# Patient Record
Sex: Male | Born: 2000 | Hispanic: No | Marital: Single | Smoking: Never smoker
Health system: Southern US, Community
[De-identification: ages and names within clinical notes are randomized; demographics above are authoritative.]

## PROBLEM LIST (undated history)

## (undated) ENCOUNTER — Emergency Department (HOSPITAL_COMMUNITY): Discharge status: Absent without leave | Payer: Medicaid Other

---
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
## 2020-01-18 DIAGNOSIS — Z20822 Contact with and (suspected) exposure to covid-19: Secondary | ICD-10-CM | POA: Diagnosis not present

## 2021-03-17 DIAGNOSIS — R300 Dysuria: Secondary | ICD-10-CM | POA: Diagnosis not present

## 2021-10-12 ENCOUNTER — Encounter: Payer: Self-pay | Admitting: Nurse Practitioner

## 2021-10-12 ENCOUNTER — Ambulatory Visit: Payer: Medicaid Other | Admitting: Nurse Practitioner

## 2021-10-12 VITALS — BP 123/72 | HR 68 | Temp 98.6°F | Ht 67.0 in | Wt 158.2 lb

## 2021-10-12 DIAGNOSIS — R21 Rash and other nonspecific skin eruption: Secondary | ICD-10-CM

## 2021-10-12 MED ORDER — HYDROCORTISONE 1 % EX LOTN: 1.0000 | TOPICAL_LOTION | Freq: Two times a day (BID) | CUTANEOUS | 0 refills | Status: DC

## 2021-10-12 NOTE — Progress Notes (Signed)
Acute Office Visit  Subjective:     Patient ID: Luke Hughes, male    DOB: [birth year], 21 y.o.   MRN: [id number]  Chief Complaint  Patient presents with   Rash    Rash under nose for about 6 months     Rash This is a recurrent problem. The current episode started yesterday. The problem has been gradually improving since onset. The affected locations include the face. The rash is characterized by dryness, itchiness and peeling. He was exposed to nothing. Pertinent negatives include no congestion, cough, facial edema, fatigue or fever. Past treatments include topical steroids. The treatment provided mild relief.     Review of Systems  Constitutional:  Negative for fatigue and fever.  HENT:  Negative for congestion.   Eyes: Negative.   Respiratory:  Negative for cough.   Cardiovascular: Negative.   Skin:  Positive for rash.  All other systems reviewed and are negative.       Objective:    BP 123/72   Pulse 68   Temp 98.6 F (37 C)   Ht 5\' 7"  (1.702 m)   Wt 158 lb 3.2 oz (71.8 kg)   SpO2 97%   BMI 24.78 kg/m  BP Readings from Last 3 Encounters:  10/12/21 123/72  03/08/18 122/72 (69 %, Z = 0.50 /  68 %, Z = 0.47)*  09/01/17 122/80 (72 %, Z = 0.58 /  91 %, Z = 1.34)*   *BP percentiles are based on the 2017 AAP Clinical Practice Guideline for boys   Wt Readings from Last 3 Encounters:  10/12/21 158 lb 3.2 oz (71.8 kg)  03/08/18 142 lb 9.6 oz (64.7 kg) (42 %, Z= -0.21)*  09/01/17 140 lb 12.8 oz (63.9 kg) (43 %, Z= -0.16)*   * Growth percentiles are based on CDC (Boys, 2-20 Years) data.      Physical Exam Vitals and nursing note reviewed.  Constitutional:      Appearance: Normal appearance.  HENT:     Head: Normocephalic.     Right Ear: External ear normal.     Left Ear: External ear normal.     Nose: Nose normal.  Cardiovascular:     Rate and Rhythm: Normal rate and regular rhythm.     Pulses: Normal pulses.     Heart sounds: Normal heart sounds.   Pulmonary:     Effort: Pulmonary effort is normal.     Breath sounds: Normal breath sounds.  Skin:    Findings: Rash present.  Neurological:     Mental Status: He is alert and oriented to person, place, and time.     No results found for any visits on 10/12/21.      Assessment & Plan:  Rash assessed, I  provided sample of CeraV to help with future skin break down and rash. B12 completed to rule out b12 deficiency dermatitis.  Patient knows to follow up with worsening unresolved symptoms.   Problem List Items Addressed This Visit   None Visit Diagnoses     Rash    -  Primary   Relevant Medications   hydrocortisone 1 % lotion   Other Relevant Orders   Vitamin B12       Meds ordered this encounter  Medications   hydrocortisone 1 % lotion    Sig: Apply 1 Application topically 2 (two) times daily.    Dispense:  118 mL    Refill:  0    Order Specific Question:   Supervising  Provider    Answer:   Mechele Claude [[id number]]    Return if symptoms worsen or fail to improve.  Daryll Drown, NP

## 2021-10-12 NOTE — Patient Instructions (Signed)
Rash, Adult  A rash is a change in the color of your skin. A rash can also change the way your skin feels. There are many different conditions and factors that can cause a rash. Follow these instructions at home: The goal of treatment is to stop the itching and keep the rash from spreading. Watch for any changes in your symptoms. Let your doctor know about them. Follow these instructions to help with your condition: Medicine Take or apply over-the-counter and prescription medicines only as told by your doctor. These may include medicines: To treat red or swollen skin (corticosteroid creams). To treat itching. To treat an allergy (oral antihistamines). To treat very bad symptoms (oral corticosteroids).  Skin care Put cool cloths (compresses) on the affected areas. Do not scratch or rub your skin. Avoid covering the rash. Make sure that the rash is exposed to air as much as possible. Managing itching and discomfort Avoid hot showers or baths. These can make itching worse. A cold shower may help. Try taking a bath with: Epsom salts. You can get these at your local pharmacy or grocery store. Follow the instructions on the package. Baking soda. Pour a small amount into the bath as told by your doctor. Colloidal oatmeal. You can get this at your local pharmacy or grocery store. Follow the instructions on the package. Try putting baking soda paste onto your skin. Stir water into baking soda until it gets like a paste. Try putting on a lotion that relieves itchiness (calamine lotion). Keep cool and out of the sun. Sweating and being hot can make itching worse. General instructions  Rest as needed. Drink enough fluid to keep your pee (urine) pale yellow. Wear loose-fitting clothing. Avoid scented soaps, detergents, and perfumes. Use gentle soaps, detergents, perfumes, and other cosmetic products. Avoid anything that causes your rash. Keep a journal to help track what causes your rash. Write  down: What you eat. What cosmetic products you use. What you drink. What you wear. This includes jewelry. Keep all follow-up visits as told by your doctor. This is important. Contact a doctor if: You sweat at night. You lose weight. You pee (urinate) more than normal. You pee less than normal, or you notice that your pee is a darker color than normal. You feel weak. You throw up (vomit). Your skin or the whites of your eyes look yellow (jaundice). Your skin: Tingles. Is numb. Your rash: Does not go away after a few days. Gets worse. You are: More thirsty than normal. More tired than normal. You have: New symptoms. Pain in your belly (abdomen). A fever. Watery poop (diarrhea). Get help right away if: You have a fever and your symptoms suddenly get worse. You start to feel mixed up (confused). You have a very bad headache or a stiff neck. You have very bad joint pains or stiffness. You have jerky movements that you cannot control (seizure). Your rash covers all or most of your body. The rash may or may not be painful. You have blisters that: Are on top of the rash. Grow larger. Grow together. Are painful. Are inside your nose or mouth. You have a rash that: Looks like purple pinprick-sized spots all over your body. Has a "bull's eye" or looks like a target. Is red and painful, causes your skin to peel, and is not from being in the sun too long. Summary A rash is a change in the color of your skin. A rash can also change the way your skin   feels. The goal of treatment is to stop the itching and keep the rash from spreading. Take or apply over-the-counter and prescription medicines only as told by your doctor. Contact a doctor if you have new symptoms or symptoms that get worse. Keep all follow-up visits as told by your doctor. This is important. This information is not intended to replace advice given to you by your health care provider. Make sure you discuss any  questions you have with your health care provider. Document Revised: 12/03/2020 Document Reviewed: 12/03/2020 Elsevier Patient Education  2023 [employer]  

## 2021-10-13 LAB — VITAMIN B12: Vitamin B-12: 901 pg/mL (ref 232–1245)

## 2021-11-11 ENCOUNTER — Telehealth: Payer: Self-pay | Admitting: Nurse Practitioner

## 2021-11-23 ENCOUNTER — Ambulatory Visit (INDEPENDENT_AMBULATORY_CARE_PROVIDER_SITE_OTHER): Payer: Medicaid Other

## 2021-11-23 ENCOUNTER — Encounter: Payer: Self-pay | Admitting: Nurse Practitioner

## 2021-11-23 ENCOUNTER — Ambulatory Visit: Payer: Medicaid Other | Admitting: Nurse Practitioner

## 2021-11-23 VITALS — BP 115/64 | HR 88 | Temp 98.8°F | Ht 67.0 in | Wt 163.0 lb

## 2021-11-23 DIAGNOSIS — R6882 Decreased libido: Secondary | ICD-10-CM | POA: Diagnosis not present

## 2021-11-23 DIAGNOSIS — M5410 Radiculopathy, site unspecified: Secondary | ICD-10-CM

## 2021-11-23 DIAGNOSIS — M545 Low back pain, unspecified: Secondary | ICD-10-CM | POA: Diagnosis not present

## 2021-11-23 MED ORDER — METHOCARBAMOL 500 MG PO TABS: 500.0000 mg | ORAL_TABLET | Freq: Four times a day (QID) | ORAL | 1 refills | Status: AC

## 2021-11-23 MED ORDER — IBUPROFEN 600 MG PO TABS: 600.0000 mg | ORAL_TABLET | Freq: Three times a day (TID) | ORAL | 0 refills | Status: AC | PRN

## 2021-11-23 NOTE — Patient Instructions (Signed)
Acute Back Pain, Adult Acute back pain is sudden and usually short-lived. It is often caused by an injury to the muscles and tissues in the back. The injury may result from: A muscle, tendon, or ligament getting overstretched or torn. Ligaments are tissues that connect bones to each other. Lifting something improperly can cause a back strain. Wear and tear (degeneration) of the spinal disks. Spinal disks are circular tissue that provide cushioning between the bones of the spine (vertebrae). Twisting motions, such as while playing sports or doing yard work. A hit to the back. Arthritis. You may have a physical exam, lab tests, and imaging tests to find the cause of your pain. Acute back pain usually goes away with rest and home care. Follow these instructions at home: Managing pain, stiffness, and swelling Take over-the-counter and prescription medicines only as told by your health care provider. Treatment may include medicines for pain and inflammation that are taken by mouth or applied to the skin, or muscle relaxants. Your health care provider may recommend applying ice during the first 24-48 hours after your pain starts. To do this: Put ice in a plastic bag. Place a towel between your skin and the bag. Leave the ice on for 20 minutes, 2-3 times a day. Remove the ice if your skin turns bright red. This is very important. If you cannot feel pain, heat, or cold, you have a greater risk of damage to the area. If directed, apply heat to the affected area as often as told by your health care provider. Use the heat source that your health care provider recommends, such as a moist heat pack or a heating pad. Place a towel between your skin and the heat source. Leave the heat on for 20-30 minutes. Remove the heat if your skin turns bright red. This is especially important if you are unable to feel pain, heat, or cold. You have a greater risk of getting burned. Activity  Do not stay in bed. Staying in  bed for more than 1-2 days can delay your recovery. Sit up and stand up straight. Avoid leaning forward when you sit or hunching over when you stand. If you work at a desk, sit close to it so you do not need to lean over. Keep your chin tucked in. Keep your neck drawn back, and keep your elbows bent at a 90-degree angle (right angle). Sit high and close to the steering wheel when you drive. Add lower back (lumbar) support to your car seat, if needed. Take short walks on even surfaces as soon as you are able. Try to increase the length of time you walk each day. Do not sit, drive, or stand in one place for more than 30 minutes at a time. Sitting or standing for long periods of time can put stress on your back. Do not drive or use heavy machinery while taking prescription pain medicine. Use proper lifting techniques. When you bend and lift, use positions that put less stress on your back: Bend your knees. Keep the load close to your body. Avoid twisting. Exercise regularly as told by your health care provider. Exercising helps your back heal faster and helps prevent back injuries by keeping muscles strong and flexible. Work with a physical therapist to make a safe exercise program, as recommended by your health care provider. Do any exercises as told by your physical therapist. Lifestyle Maintain a healthy weight. Extra weight puts stress on your back and makes it difficult to have good   posture. Avoid activities or situations that make you feel anxious or stressed. Stress and anxiety increase muscle tension and can make back pain worse. Learn ways to manage anxiety and stress, such as through exercise. General instructions Sleep on a firm mattress in a comfortable position. Try lying on your side with your knees slightly bent. If you lie on your back, put a pillow under your knees. Keep your head and neck in a straight line with your spine (neutral position) when using electronic equipment like  smartphones or pads. To do this: Raise your smartphone or pad to look at it instead of bending your head or neck to look down. Put the smartphone or pad at the level of your face while looking at the screen. Follow your treatment plan as told by your health care provider. This may include: Cognitive or behavioral therapy. Acupuncture or massage therapy. Meditation or yoga. Contact a health care provider if: You have pain that is not relieved with rest or medicine. You have increasing pain going down into your legs or buttocks. Your pain does not improve after 2 weeks. You have pain at night. You lose weight without trying. You have a fever or chills. You develop nausea or vomiting. You develop abdominal pain. Get help right away if: You develop new bowel or bladder control problems. You have unusual weakness or numbness in your arms or legs. You feel faint. These symptoms may represent a serious problem that is an emergency. Do not wait to see if the symptoms will go away. Get medical help right away. Call your local emergency services (911 in the [place] Do not drive yourself to the hospital. Summary Acute back pain is sudden and usually short-lived. Use proper lifting techniques. When you bend and lift, use positions that put less stress on your back. Take over-the-counter and prescription medicines only as told by your health care provider, and apply heat or ice as told. This information is not intended to replace advice given to you by your health care provider. Make sure you discuss any questions you have with your health care provider. Document Revised: 05/15/2020 Document Reviewed: 05/15/2020 Elsevier Patient Education  2023 Elsevier Inc.  

## 2021-11-23 NOTE — Progress Notes (Signed)
Acute Office Visit  Subjective:     Patient ID: Luke Hughes, male    DOB: 09/20/2000, 21 y.o.   MRN: [id number]  Chief Complaint  Patient presents with   Back Pain    Back Pain This is a new problem. The current episode started in the past 7 days. The problem occurs constantly. The problem is unchanged. The pain is present in the gluteal. The quality of the pain is described as aching. The pain is at a severity of 4/10. The pain is moderate. The pain is The same all the time. The symptoms are aggravated by bending and position. Stiffness is present In the morning. Pertinent negatives include no bowel incontinence, dysuria, headaches, numbness, paresis or tingling. He has tried nothing for the symptoms.     Review of Systems  Constitutional: Negative.   HENT: Negative.    Cardiovascular: Negative.   Gastrointestinal:  Negative for bowel incontinence.  Genitourinary:  Negative for dysuria.  Musculoskeletal:  Positive for back pain.  Skin: Negative.   Neurological:  Negative for tingling, numbness and headaches.  All other systems reviewed and are negative.       Objective:    BP 115/64   Pulse 88   Temp 98.8 F (37.1 C)   Ht 5\' 7"  (1.702 m)   Wt 163 lb (73.9 kg)   SpO2 96%   BMI 25.53 kg/m  BP Readings from Last 3 Encounters:  11/23/21 115/64  10/12/21 123/72  03/08/18 122/72 (69 %, Z = 0.50 /  68 %, Z = 0.47)*   *BP percentiles are based on the 2017 AAP Clinical Practice Guideline for boys   Wt Readings from Last 3 Encounters:  11/23/21 163 lb (73.9 kg)  10/12/21 158 lb 3.2 oz (71.8 kg)  03/08/18 142 lb 9.6 oz (64.7 kg) (42 %, Z= -0.21)*   * Growth percentiles are based on CDC (Boys, 2-20 Years) data.      Physical Exam Vitals and nursing note reviewed.  Constitutional:      Appearance: Normal appearance.  HENT:     Right Ear: External ear normal.     Left Ear: External ear normal.     Nose: Nose normal.  Eyes:     Conjunctiva/sclera: Conjunctivae  normal.  Cardiovascular:     Rate and Rhythm: Normal rate and regular rhythm.     Pulses: Normal pulses.     Heart sounds: Normal heart sounds.  Pulmonary:     Effort: Pulmonary effort is normal.     Breath sounds: Normal breath sounds.  Abdominal:     General: Bowel sounds are normal.  Musculoskeletal:     Thoracic back: Tenderness present. Decreased range of motion.       Back:     Comments: Right side thoracic back pain.  Skin:    General: Skin is warm.     Findings: No erythema or rash.  Neurological:     General: No focal deficit present.     Mental Status: He is alert and oriented to person, place, and time.     No results found for any visits on 11/23/21.      Assessment & Plan:  Back pain symptoms not well controlled over the past 7 days.  Patient reports lifting boxes at work and probably twisted wrong.  Completed back x-ray will follow-up with MRI if negative to rule out pinched nerve.  Pain is radiating to bilateral arms.  Ibuprofen 600 mg tablet as needed for pain,  Robaxin for musculoskeletal pain as needed.  Follow-up with unresolved symptoms.   Patient is reporting low libido in the past few weeks.  Completed testosterone labs results pending. Problem List Items Addressed This Visit   None Visit Diagnoses     Low libido    -  Primary   Relevant Orders   Testosterone,Free and Total   Back pain with radiculopathy       Relevant Medications   methocarbamol (ROBAXIN) 500 MG tablet   ibuprofen (ADVIL) 600 MG tablet   Other Relevant Orders   DG Lumbar Spine 2-3 Views       Meds ordered this encounter  Medications   methocarbamol (ROBAXIN) 500 MG tablet    Sig: Take 1 tablet (500 mg total) by mouth 4 (four) times daily.    Dispense:  30 tablet    Refill:  1    Order Specific Question:   Supervising Provider    Answer:   Standley Brooking   ibuprofen (ADVIL) 600 MG tablet    Sig: Take 1 tablet (600 mg total) by mouth every 8 (eight) hours as  needed.    Dispense:  30 tablet    Refill:  0    Order Specific Question:   Supervising Provider    Answer:   Mechele Claude [phone]    Return if symptoms worsen or fail to improve.  Daryll Drown, NP

## 2021-11-26 NOTE — Progress Notes (Signed)
Patient returning call. Please call back. Unable to see in [place]

## 2021-11-29 LAB — TESTOSTERONE,FREE AND TOTAL
Testosterone, Free: 13.3 pg/mL (ref 9.3–26.5)
Testosterone: 597 ng/dL (ref 264–916)

## 2022-01-03 DIAGNOSIS — J029 Acute pharyngitis, unspecified: Secondary | ICD-10-CM | POA: Diagnosis not present

## 2022-02-22 DIAGNOSIS — B002 Herpesviral gingivostomatitis and pharyngotonsillitis: Secondary | ICD-10-CM | POA: Diagnosis not present

## 2022-02-22 DIAGNOSIS — M25532 Pain in left wrist: Secondary | ICD-10-CM | POA: Diagnosis not present

## 2022-02-22 DIAGNOSIS — X500XXA Overexertion from strenuous movement or load, initial encounter: Secondary | ICD-10-CM | POA: Diagnosis not present

## 2024-03-10 NOTE — Progress Notes (Signed)
 "  Discussed with patient/guardian the use of audio recording for this encounter.  Risks, benefits and alternatives (including option to decline recording) were reviewed.  Patient expressed understanding and verbally confirmed consent to proceed with recording.   Subjective  The following information was reviewed by members of the visit team:  Tobacco  Allergies  Meds  Problems  Med Hx  Surg Hx  OB Status   Fam Hx    Luke Hughes is a 24 y.o. male who presents for Cough (Cough he thinks may be PNA, has been going on over a week. No fever chills body aches now, did have a fever when it started. ) History of Present Illness The patient presents for evaluation of a persistent cough.  He has had the cough for one week, initially with fever and sweating for 2-3 days. The cough is productive with yellow mucus, no hemoptysis. No dyspnea or chest pain. He reports chest, stomach, and back soreness for three days due to coughing. He also has a runny nose managed by blowing his nose after a shower. No known allergies or recent allergen exposure. No history of bronchitis. Occasional hot flashes, suspecting influenza. Self-medicating with Robitussin, NyQuil, and Advil  for headaches.  ALLERGIES The patient has no known allergies.  MEDICATIONS Current: Robitussin, NyQuil, Advil   Review of Systems  Constitutional:  Positive for fever. Negative for chills.  HENT:  Positive for rhinorrhea.   Respiratory:  Positive for cough. Negative for shortness of breath.   Cardiovascular:  Negative for chest pain.  Gastrointestinal:  Negative for abdominal pain, diarrhea, nausea and vomiting.  Genitourinary:  Negative for dysuria.  Neurological:  Negative for dizziness and headaches.    Objective  Blood pressure 142/88, pulse 80, temperature 98.2 F (36.8 C), temperature source Tympanic, resp. rate 18, height 1.702 m (5' 7), weight 72.6 kg (160 lb), SpO2 100%.  No LMP for male  patient.   Behavioral Health Screening  Patient Health Questionnaire-2 Score: 0 (03/10/2024  5:25 PM)      Patient's Depression screening is Negative   Depression Plan: Normal/Negative Screening  Physical Exam Vitals and nursing note reviewed.  Constitutional:      Appearance: Normal appearance. He is normal weight.  HENT:     Mouth/Throat:     Mouth: Mucous membranes are moist.     Pharynx: Uvula midline. Posterior oropharyngeal erythema present.     Comments: No drooling trismus or voice change Cardiovascular:     Rate and Rhythm: Normal rate and regular rhythm.     Heart sounds: Normal heart sounds.  Pulmonary:     Effort: Pulmonary effort is normal.     Breath sounds: Normal breath sounds.  Musculoskeletal:     Cervical back: Normal range of motion and neck supple.  Lymphadenopathy:     Cervical: Cervical adenopathy present.  Skin:    General: Skin is warm and dry.     Capillary Refill: Capillary refill takes less than 2 seconds.  Neurological:     General: No focal deficit present.     Mental Status: He is alert and oriented to person, place, and time.  Psychiatric:        Mood and Affect: Mood normal.        Behavior: Behavior normal.     Recent Results (from the past 24 hours)  POC Rapid Strep A (IDNOW)   Collection Time: 03/10/24  6:24 PM  Result Value Ref Range   Strep A Molecular Negative Negative  Internal Control Acceptable     Radiologist interpretation:    XR Chest 2 Views  Final Result by Liborio Jake Mage, MD (01/04 1752)  XR CHEST 2 VIEWS, 03/10/2024 5:49 PM    INDICATION: Acute cough \ R05.1 Acute cough   COMPARISON: 12/30/2020    FINDINGS:     Cardiovascular: Cardiac silhouette and pulmonary vasculature are within   normal limits.  Mediastinum: Within normal limits.  Lungs/pleura: Clear.  Upper abdomen: Visualized portions are unremarkable.   Chest wall/osseous structures: Unremarkable.      IMPRESSION:  There is no evidence of  acute cardiac or pulmonary abnormality.           Assessment & Plan 1. Post-viral bronchitis. Symptoms suggest post-viral bronchitis. No evidence of pneumonia or strep throat. Ordered chest x-ray to rule out pneumonia. Prescribed steroids, cough medication, and inhaler. Advised humidifier, steam inhalation, and saltwater gargles. Steroids to be taken with food. Issued work note.     Attestation    Assessment/Plan   Luke Hughes was seen today for cough.  Diagnoses and all orders for this visit:  Acute cough -     XR Chest 2 Views -     Ambulatory referral to [facility]; Future  Sore throat -     POC Rapid Strep A (IDNOW)  Bronchitis -     dexAMETHasone (DECADRON) 6 mg tablet; Take 1 tablet (6 mg total) by mouth daily with breakfast for 5 days. -     albuterol HFA (PROVENTIL HFA;VENTOLIN HFA;PROAIR HFA) 90 mcg/actuation inhaler; Inhale 2 puffs every 4 (four) hours as needed for shortness of breath (frequent cough). -     benzonatate (TESSALON) 100 mg capsule; Take 1 capsule (100 mg total) by mouth 3 (three) times a day as needed for cough.    Patient has been instructed on RX/ OTC medications, dosages, side effects, and possible interactions as associated with each diagnosis in my impression and plan above.  2.   Patient education (verbal/handout) given on diagnosis, pathophysiology, treatment of diagnosis, side effects of medication use for treatment, restrictions while taking medication.  Supportive       Care measures as directed on AVS.  Red Flags associated with diagnosis/es were reviewed and patient instructed on action plan if red flags develop.  3.   Urgent Care Disposition:  Follow up with PCP       They have been instructed that if symptoms worse that should go to Urgent Care, go to the nearest ED, or activate EMS.  4.   Patient agreed with plan and voiced understanding.  NO barriers to adherence perceived by myself.  Electronically signed: Rosaline Jama Collet  FNP  Sun 03/10/2024 6:47 PM    "

## 2024-03-12 NOTE — Progress Notes (Signed)
 [facility] [place]  Return Visit Luke Hughes DOB: [birth year]  MRN: [id number] Visit Date: 03/12/2024  Encounter Provider: Elberta Ebb Cone, NP  Chief Complaint  Patient presents with   New Patient   Annual Exam   Establish Care   Consult    Discuss getting hormone levels checked     History of Present Illness The patient presents for a physical exam.  He began a testosterone -boosting supplement (RAD 140) regimen approximately 45 days ago, initially at a dose of 10 mg, which was increased to 20 mg after 5 weeks. He reports an improvement in his strength and overall well-being while on the supplement. However, he also experienced erectile dysfunction during this period, which he attributes to the duration of supplement use. He discontinued the supplement on 03/07/2024 and has been experiencing ongoing issues with erection since. He is considering post-cycle therapy (PCT) to restore his baseline testosterone  levels. He maintains an active lifestyle, including regular workouts. He is currently sexually active and reports normal sexual function prior to the onset of his symptoms. He is not concerned about STI. He is contemplating seeking care at [facility], a hormone therapy clinic on [address]. He recalls a previous consultation with his primary care physician a few years ago due to low libido, which coincided with a period of emotional distress following a breakup. His testosterone  levels were within the normal range at that time. 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  He has been experiencing a cough, which he attributes to bronchitis. He has no history of asthma. He was prescribed Tessalon Perles, an inhaler, and steroids for this condition at urgent care two days ago.    HPI HEALTH MAINTENANCE: Luke Hughes presents for a health maintenance exam. SCREENING TESTS: ---Last physical exam: unknown ---He does perform monthly self  testicular exams. ---Last colonoscopy: NA ---Last PSA: no prior LIFESTYLE:  Sleep hours: adequate Seatbelt use: always LAB/IMMUNIZATIONS: ---Last lipid profile: unknown ---Last tetanus vaccine: 2013 ---Last influenza vaccine: 2018 ---Last pneumococcal vaccine: NA ---Shingrix vaccine: NA ---Hep C antibody: unknown MEDICATIONS: OTC Vitamins: see med list Other OTC Meds: see med list  ROS-  Constitutional symptoms: negative Eyes:  negative Ear, nose, throat:  negative Cardiovascular:  negative Respiratory:  negative Gastrointestinal:  negative Genitourinary:  erectile dysfunction Skin:  negative Neurological:  negative Musculoskeletal:  negative Psychiatric:  negative Endocrine:  negative Hematological:  negative Allergic:  negative   Medical History:  Medical History[1]  There are no active problems to display for this patient.   Current Medications:  Medications Ordered Prior to Encounter[2] Current Medications[3]  Allergies: Allergies[4]   Immunizations:   There is no immunization history on file for this patient.  Surgical History- Surgical History[5]  Family history-  Family History[6] Social history-  Social History[7]   Physical Exam: BP 132/85   Pulse 77   Temp 98.2 F (36.8 C)   Resp 16   Ht 1.664 m (5' 5.5)   Wt 73 kg (161 lb)   SpO2 96%   BMI 26.38 kg/m  Wt Readings from Last 3 Encounters:  03/10/24 72.6 kg (160 lb)  06/17/22 73.5 kg (162 lb)  02/22/22 74.8 kg (165 lb)   Physical Exam Vitals and nursing note reviewed.  Constitutional:      General: He is not in acute distress.    Appearance: Normal appearance. He is normal weight. He is not ill-appearing, toxic-appearing or diaphoretic.  HENT:  Head: Normocephalic and atraumatic.     Right Ear: Tympanic membrane, ear canal and external ear normal.     Left Ear: Tympanic membrane, ear canal and external ear normal.     Nose: Nose normal.     Mouth/Throat:     Mouth: Mucous  membranes are moist.     Pharynx: Oropharynx is clear.  Eyes:     Extraocular Movements: Extraocular movements intact.     Conjunctiva/sclera: Conjunctivae normal.     Pupils: Pupils are equal, round, and reactive to light.  Neck:     Vascular: No carotid bruit.  Cardiovascular:     Rate and Rhythm: Normal rate and regular rhythm.     Pulses: Normal pulses.     Heart sounds: Normal heart sounds.  Pulmonary:     Effort: Pulmonary effort is normal.     Breath sounds: Normal breath sounds.  Abdominal:     General: Abdomen is flat. Bowel sounds are normal. There is no distension.     Palpations: Abdomen is soft. There is no mass.     Tenderness: There is no abdominal tenderness. There is no guarding or rebound.     Hernia: No hernia is present.  Musculoskeletal:        General: No swelling, tenderness, deformity or signs of injury. Normal range of motion.     Cervical back: Normal range of motion and neck supple. No rigidity or tenderness.     Right lower leg: No edema.     Left lower leg: No edema.  Lymphadenopathy:     Cervical: No cervical adenopathy.  Skin:    General: Skin is warm and dry.     Capillary Refill: Capillary refill takes less than 2 seconds.     Coloration: Skin is not jaundiced or pale.     Findings: No bruising, erythema, lesion or rash.  Neurological:     General: No focal deficit present.     Mental Status: He is alert and oriented to person, place, and time. Mental status is at baseline.     Cranial Nerves: No cranial nerve deficit.     Sensory: No sensory deficit.     Motor: No weakness.     Coordination: Coordination normal.     Gait: Gait normal.     Deep Tendon Reflexes: Reflexes normal.  Psychiatric:        Mood and Affect: Mood normal.        Behavior: Behavior normal.        Thought Content: Thought content normal.        Judgment: Judgment normal.      Assessment and Plan:  1. Annual physical exam (Primary) reviewed recommendations for  heart healthy diet and routine exercise with goal of 150 min/week. Immunizations reviewed and counseled PSA at 50 Colon cancer screening at 45 Monthly self testicular exams recommended - Lipid Panel; Future - Comprehensive Metabolic Panel; Future - CBC with Differential; Future  2. Erectile dysfunction, unspecified erectile dysfunction type - He reports experiencing erectile dysfunction after taking a testosterone -boosting supplement for 5 days at a higher dose. He has since stopped the supplement but continues to experience symptoms. - A comprehensive blood workup, including testosterone  levels, will be ordered. He is advised to undergo these tests in a fasting state, preferably between 8:00 and 10:00 AM. A referral to a urologist will be made for further evaluation and management. - Ambulatory referral to Urology; Future - Testosterone , Total, Free, and Percent Free; Future -  Sex Hormone-Binding Globulin; Future - Luteinizing Hormone (LH); Future - Follicle-stimulating Hormone (FSH); Future - Prolactin; Future - Estradiol; Future  3. Screening for lipid disorders - Lipid Panel; Future  4. Need for hepatitis C screening test - Hepatitis C Virus (HCV) Antibody Screen With Confirmation; Future   Return in about 1 year (around 03/12/2025) for CPE.  Electronically signed by: Elberta Ebb Cone, NP 03/12/2024 4:06 PM       [1] Past Medical History: Diagnosis Date   Acne    Dermatitis    Eczema   [2] Current Outpatient Medications on File Prior to Visit  Medication Sig Dispense Refill   albuterol HFA (PROVENTIL HFA;VENTOLIN HFA;PROAIR HFA) 90 mcg/actuation inhaler Inhale 2 puffs every 4 (four) hours as needed for shortness of breath (frequent cough). 1 each 0   benzonatate (TESSALON) 100 mg capsule Take 1 capsule (100 mg total) by mouth 3 (three) times a day as needed for cough. 30 capsule 0   dexAMETHasone (DECADRON) 6 mg tablet Take 1 tablet (6 mg total) by mouth daily  with breakfast for 5 days. 5 tablet 0   meloxicam (MOBIC) 15 mg tablet Take 15 mg by mouth Once Daily for 14 days. 14 tablet 0   valACYclovir (VALTREX) 1 gram tablet Take 1,000 mg by mouth 2 (two) times a day for 10 days. Indications: herpes simplex infection 20 tablet 0   No current facility-administered medications on file prior to visit.  [3]  Current Outpatient Medications:    albuterol HFA (PROVENTIL HFA;VENTOLIN HFA;PROAIR HFA) 90 mcg/actuation inhaler, Inhale 2 puffs every 4 (four) hours as needed for shortness of breath (frequent cough)., Disp: 1 each, Rfl: 0   benzonatate (TESSALON) 100 mg capsule, Take 1 capsule (100 mg total) by mouth 3 (three) times a day as needed for cough., Disp: 30 capsule, Rfl: 0   dexAMETHasone (DECADRON) 6 mg tablet, Take 1 tablet (6 mg total) by mouth daily with breakfast for 5 days., Disp: 5 tablet, Rfl: 0   meloxicam (MOBIC) 15 mg tablet, Take 15 mg by mouth Once Daily for 14 days., Disp: 14 tablet, Rfl: 0   valACYclovir (VALTREX) 1 gram tablet, Take 1,000 mg by mouth 2 (two) times a day for 10 days. Indications: herpes simplex infection, Disp: 20 tablet, Rfl: 0 [4] No Known Allergies [5] Past Surgical History: Procedure Laterality Date   NO PAST SURGERIES     Procedure: NO PAST SURGERIES  [6] Family History Problem Relation Name Age of Onset   Diabetes Neg Hx     Cancer Neg Hx     Psoriasis Neg Hx     Eczema Neg Hx    [7] Social History Socioeconomic History   Marital status: Single  Tobacco Use   Smoking status: Never    Passive exposure: Past   Smokeless tobacco: Never  Vaping Use   Vaping status: Never Used  Substance and Sexual Activity   Alcohol use: Never   Drug use: Not Currently    Types: Marijuana   Sexual activity: Yes   Social Drivers of Health   Safety: Low Risk (03/10/2024)   Safety    How often does anyone, including family and friends, physically hurt you?: Never    How often does anyone,  including family and friends, insult or talk down to you?: Never    How often does anyone, including family and friends, threaten you with harm?: Never    How often does anyone, including family and friends, scream or curse at you?:  Never  Tobacco Use: Low Risk (03/12/2024)   Patient History    Smoking Tobacco Use: Never    Smokeless Tobacco Use: Never    Passive Exposure: Past  Depression: Not At Risk (03/10/2024)   PHQ-2    PHQ-2 Score: 0

## 2024-03-15 NOTE — Progress Notes (Signed)
 Many of your labs are normal, however I am concerned about the degree of elevation of your liver enzymes. RAD 140 is commonly known to cause severe liver issues. I think we should repeat these labs in 1 month, if persistently elevated will refer you to a liver specialist. If at any point you develop confusion, extreme tiredness, yellow skin or eyes, itching, this could be sign of worsening. Please notify me immediately.   Your shbg is low and LH high but I do not have the testosterone  levels back yet. We will make a plan on where to go from there when available.  Urology has attempted to call you to schedule. Please call them back or they will cancel referral.

## 2024-03-16 ENCOUNTER — Other Ambulatory Visit: Payer: Self-pay

## 2024-03-16 ENCOUNTER — Emergency Department (HOSPITAL_BASED_OUTPATIENT_CLINIC_OR_DEPARTMENT_OTHER)

## 2024-03-16 ENCOUNTER — Encounter (HOSPITAL_BASED_OUTPATIENT_CLINIC_OR_DEPARTMENT_OTHER): Payer: Self-pay

## 2024-03-16 ENCOUNTER — Emergency Department (HOSPITAL_BASED_OUTPATIENT_CLINIC_OR_DEPARTMENT_OTHER)
Admission: EM | Admit: 2024-03-16 | Discharge: 2024-03-16 | Disposition: A | Discharge status: Home / Self Care | Attending: Emergency Medicine | Admitting: Emergency Medicine

## 2024-03-16 DIAGNOSIS — N50812 Left testicular pain: Secondary | ICD-10-CM

## 2024-03-16 DIAGNOSIS — I861 Scrotal varices: Secondary | ICD-10-CM | POA: Diagnosis not present

## 2024-03-16 DIAGNOSIS — R1030 Lower abdominal pain, unspecified: Secondary | ICD-10-CM | POA: Insufficient documentation

## 2024-03-16 DIAGNOSIS — R7401 Elevation of levels of liver transaminase levels: Secondary | ICD-10-CM | POA: Insufficient documentation

## 2024-03-16 LAB — URINALYSIS, ROUTINE W REFLEX MICROSCOPIC
Bacteria, UA: NONE SEEN
Bilirubin Urine: NEGATIVE
Glucose, UA: NEGATIVE mg/dL
Hgb urine dipstick: NEGATIVE
Ketones, ur: NEGATIVE mg/dL
Leukocytes,Ua: NEGATIVE
Nitrite: NEGATIVE
Specific Gravity, Urine: 1.032 — ABNORMAL HIGH (ref 1.005–1.030)
pH: 7.5 (ref 5.0–8.0)

## 2024-03-16 NOTE — ED Provider Notes (Signed)
 " [place] EMERGENCY DEPARTMENT AT [facility] Provider Note   CSN: [id number] Arrival date & time: 03/16/24  1122     Patient presents with: Testicle Pain   Luke Hughes is a 24 y.o. male.   Patient with history of testicular torsion status post orchiectomy --presents to the emergency department today for evaluation of testicular pain and lower abdominal pain.  Patient reports increasing pain in the testicle area, over the past couple of days.  He reports mild dysuria.  Denies penile discharge or drainage.  No fevers, vomiting.  Patient seems to notice the pain more when he is sitting, or when standing and his legs rub against the scrotum.   Patient recently seen and evaluated by PCP.  Patient admits to taking a selective androgen receptor modulator for a couple of months --which he discontinued at the beginning of this year.  He was experiencing some erectile dysfunction issues.  Patient had recent workup demonstrating low testosterone  levels, transaminitis with normal bilirubin.  Patient reports having an upcoming urology appointment in about 4 weeks.  Per PCP note, they will monitor transaminases and follow-up regarding hormone panel that was performed.       Prior to Admission medications  Medication Sig Start Date End Date Taking? Authorizing Provider  ibuprofen  (ADVIL ) 600 MG tablet Take 1 tablet (600 mg total) by mouth every 8 (eight) hours as needed. 11/23/21   Ijaola, Onyeje M, NP  methocarbamol  (ROBAXIN ) 500 MG tablet Take 1 tablet (500 mg total) by mouth 4 (four) times daily. 11/23/21   Cherylene Homer HERO, NP    Allergies: Patient has no known allergies.    Review of Systems  Updated Vital Signs BP (!) 146/84   Pulse 61   Temp 98.6 F (37 C) (Oral)   Resp 16   SpO2 99%   Physical Exam Vitals and nursing note reviewed. Exam conducted with a chaperone present (RN present).  Constitutional:      Appearance: He is well-developed.  HENT:     Head:  Normocephalic and atraumatic.  Eyes:     Conjunctiva/sclera: Conjunctivae normal.  Pulmonary:     Effort: No respiratory distress.  Genitourinary:    Penis: Normal.      Comments: Mild tenderness of the testicle, no swelling.  Mild tenderness to the suprapubic area without rebound or guarding. Musculoskeletal:     Cervical back: Normal range of motion and neck supple.  Skin:    General: Skin is warm and dry.  Neurological:     Mental Status: He is alert.     (all labs ordered are listed, but only abnormal results are displayed) Labs Reviewed  URINALYSIS, ROUTINE W REFLEX MICROSCOPIC - Abnormal; Notable for the following components:      Result Value   APPearance HAZY (*)    Specific Gravity, Urine 1.032 (*)    Protein, ur TRACE (*)    All other components within normal limits    EKG: None  Radiology: US  SCROTUM W/DOPPLER Result Date: 03/16/2024 CLINICAL DATA:  Left testicular pain for 10 days. History of right testicular torsion with right orchiectomy. EXAM: SCROTAL ULTRASOUND DOPPLER ULTRASOUND OF THE TESTICLES TECHNIQUE: Complete ultrasound examination of the testicles, epididymis, and other scrotal structures was performed. Color and spectral Doppler ultrasound were also utilized to evaluate blood flow to the testicles. COMPARISON:  None Available. FINDINGS: Right testicle Surgically absent. Left testicle Measurements:  4.0 x 2.4 x 2.9 cm. No mass or microlithiasis visualized. Doppler: There is normal vascularity  on color doppler examination. Spectral doppler arterial and venous waveforms are normal. Right epididymis:  Surgically absent. Left epididymis:  Normal in size and appearance. Hydrocele:  Absent. Varicocele:  Present on the left. IMPRESSION: 1. No evidence of left testicular torsion. 2. Left varicocele. 3. Right orchiectomy. Electronically Signed   By: Newell Eke M.D.   On: 03/16/2024 13:21     Procedures   Medications Ordered in the ED - No data to  display  ED Course  Patient seen and examined. History obtained directly from patient.  Genital exam performed with RN chaperone.  Reviewed recent outpatient workup and primary care provider note.  Labs/EKG: Ordered UA.  Imaging: Ordered scrotal ultrasound with Doppler.  Medications/Fluids: None ordered  Most recent vital signs reviewed and are as follows: BP (!) 146/84   Pulse 61   Temp 98.6 F (37 C) (Oral)   Resp 16   SpO2 99%   Initial impression: Testicular pain, lower abdominal pain.  Patient well-appearing, no distress.  ///  Reassessment performed. Patient appears stable.  Labs personally reviewed and interpreted including: UA without signs of infection or bleeding.  Imaging personally visualized and interpreted including:  Reviewed pertinent lab work and imaging with patient at bedside. Questions answered.  We discussed varicocele, likely not contributing to current symptoms, can discuss with urologist.  Most current vital signs reviewed and are as follows: BP (!) 140/80 (BP Location: Right Arm)   Pulse 75   Temp 98.3 F (36.8 C) (Oral)   Resp 18   Ht 5' 7 (1.702 m)   Wt 73.9 kg   SpO2 99%   BMI 25.52 kg/m   Plan: Discharge to home.   Prescriptions written for: None  Other home care instructions discussed: Monitoring of symptoms, OTC meds for discomfort, supportive underwear  ED return instructions discussed: Return with worsening pain or swelling  Follow-up instructions discussed: Patient encouraged to follow-up with their PCP and urology as planned.                                  Medical Decision Making Amount and/or Complexity of Data Reviewed Labs: ordered. Radiology: ordered.   Patient with testicle pain and scrotal pain and lower abdominal pain.  Other recent medical evaluation as above regarding supplement use.  In regards to his testicular pain today, no evidence of torsion, epididymitis, orchitis.  Varicocele noted.  No emergent  findings suspected.  UA is negative without signs of urinary tract infection.  Patient has scheduled urology follow-up.  I think that this is sufficient at this time.  He has questions about his testosterone  level and these would likely be best answered by urology.  Also known transaminitis.  Patient has stopped the offending supplement which is likely contributing.  He does not appear jaundiced today and I do not noticed any scleral icterus.  Current plan is to continue to have this monitored by PCP, which I think is reasonable.  We discussed avoiding heavy Tylenol and alcohol use given abnormal liver function testing.  The patient's vital signs, pertinent lab work and imaging were reviewed and interpreted as discussed in the ED course. Hospitalization was considered for further testing, treatments, or serial exams/observation. However as patient is well-appearing, has a stable exam, and reassuring studies today, I do not feel that they warrant admission at this time. This plan was discussed with the patient who verbalizes agreement and comfort with this plan  and seems reliable and able to return to the Emergency Department with worsening or changing symptoms.       Final diagnoses:  Pain in left testicle  Varicocele    ED Discharge Orders     None          Desiderio Chew, PA-C 03/16/24 1726  "

## 2024-03-16 NOTE — ED Triage Notes (Signed)
 Patient states testicular pain that radiates to lower abdomen for over a week. Denies testicular swelling. States was seen at PCP and was told liver enzymes were elevated

## 2024-03-16 NOTE — Discharge Instructions (Signed)
 Your urine test today did not show any signs of infection or bleeding.  Your testicular ultrasound showed normal-appearing testicle and epididymis with some varicose veins.  No other concerning findings.  No signs of torsion.  As we discussed, avoid alcohol and large amounts of Tylenol.  Please follow-up with urology as planned and your PCP for monitoring of your liver function test.
